# Patient Record
Sex: Male | Born: 1950 | Race: White | Hispanic: No | Marital: Married | Smoking: Never smoker
Health system: Southern US, Community
[De-identification: ages and names within clinical notes are randomized; demographics above are authoritative.]

## PROBLEM LIST (undated history)

## (undated) DIAGNOSIS — H9190 Unspecified hearing loss, unspecified ear: Secondary | ICD-10-CM

## (undated) DIAGNOSIS — K219 Gastro-esophageal reflux disease without esophagitis: Secondary | ICD-10-CM

## (undated) DIAGNOSIS — F988 Other specified behavioral and emotional disorders with onset usually occurring in childhood and adolescence: Secondary | ICD-10-CM

## (undated) DIAGNOSIS — Z8489 Family history of other specified conditions: Secondary | ICD-10-CM

## (undated) DIAGNOSIS — E7800 Pure hypercholesterolemia, unspecified: Secondary | ICD-10-CM

## (undated) DIAGNOSIS — M1990 Unspecified osteoarthritis, unspecified site: Secondary | ICD-10-CM

## (undated) HISTORY — PX: MULTIPLE TOOTH EXTRACTIONS: SHX2053

## (undated) HISTORY — PX: JOINT REPLACEMENT: SHX530

---

## 2001-04-24 ENCOUNTER — Encounter: Admission: RE | Admit: 2001-04-24 | Discharge: 2001-04-24 | Payer: Self-pay | Admitting: Internal Medicine

## 2001-04-24 ENCOUNTER — Encounter: Payer: Self-pay | Admitting: Internal Medicine

## 2004-07-06 ENCOUNTER — Ambulatory Visit (HOSPITAL_COMMUNITY): Admission: RE | Admit: 2004-07-06 | Discharge: 2004-07-06 | Payer: Self-pay | Admitting: Plastic Surgery

## 2004-07-06 ENCOUNTER — Ambulatory Visit (HOSPITAL_BASED_OUTPATIENT_CLINIC_OR_DEPARTMENT_OTHER): Admission: RE | Admit: 2004-07-06 | Discharge: 2004-07-06 | Payer: Self-pay | Admitting: Plastic Surgery

## 2006-02-20 ENCOUNTER — Encounter: Admission: RE | Admit: 2006-02-20 | Discharge: 2006-02-20 | Payer: Self-pay | Admitting: Geriatric Medicine

## 2006-05-08 ENCOUNTER — Encounter: Admission: RE | Admit: 2006-05-08 | Discharge: 2006-05-08 | Payer: Self-pay | Admitting: Orthopedic Surgery

## 2011-03-20 ENCOUNTER — Other Ambulatory Visit: Payer: Self-pay | Admitting: Otolaryngology

## 2011-03-20 DIAGNOSIS — H938X9 Other specified disorders of ear, unspecified ear: Secondary | ICD-10-CM

## 2011-04-02 ENCOUNTER — Ambulatory Visit
Admission: RE | Admit: 2011-04-02 | Discharge: 2011-04-02 | Disposition: A | Discharge status: Home / Self Care | Payer: BC Managed Care – PPO | Source: Ambulatory Visit | Attending: Otolaryngology | Admitting: Otolaryngology

## 2011-04-02 DIAGNOSIS — H938X9 Other specified disorders of ear, unspecified ear: Secondary | ICD-10-CM

## 2011-04-02 MED ORDER — GADOBENATE DIMEGLUMINE 529 MG/ML IV SOLN
17.0000 mL | Freq: Once | INTRAVENOUS | Status: AC | PRN
  Administered 2011-04-02: 17 mL via INTRAVENOUS

## 2012-03-16 ENCOUNTER — Other Ambulatory Visit: Payer: Self-pay | Admitting: Geriatric Medicine

## 2012-03-16 DIAGNOSIS — R1310 Dysphagia, unspecified: Secondary | ICD-10-CM

## 2012-03-25 ENCOUNTER — Other Ambulatory Visit: Payer: BC Managed Care – PPO

## 2012-03-27 ENCOUNTER — Ambulatory Visit
Admission: RE | Admit: 2012-03-27 | Discharge: 2012-03-27 | Disposition: A | Discharge status: Home / Self Care | Payer: BC Managed Care – PPO | Source: Ambulatory Visit | Attending: Geriatric Medicine | Admitting: Geriatric Medicine

## 2012-03-27 DIAGNOSIS — R1310 Dysphagia, unspecified: Secondary | ICD-10-CM

## 2015-03-30 ENCOUNTER — Ambulatory Visit
Admission: RE | Admit: 2015-03-30 | Discharge: 2015-03-30 | Disposition: A | Discharge status: Home / Self Care | Payer: BLUE CROSS/BLUE SHIELD | Source: Ambulatory Visit | Attending: Geriatric Medicine | Admitting: Geriatric Medicine

## 2015-03-30 ENCOUNTER — Other Ambulatory Visit: Payer: Self-pay | Admitting: Geriatric Medicine

## 2015-03-30 DIAGNOSIS — R05 Cough: Secondary | ICD-10-CM

## 2015-03-30 DIAGNOSIS — R059 Cough, unspecified: Secondary | ICD-10-CM

## 2015-06-28 ENCOUNTER — Other Ambulatory Visit: Payer: Self-pay | Admitting: Gastroenterology

## 2015-08-01 ENCOUNTER — Encounter (HOSPITAL_COMMUNITY): Payer: Self-pay | Admitting: *Deleted

## 2015-08-08 ENCOUNTER — Ambulatory Visit (HOSPITAL_COMMUNITY)
Admission: RE | Admit: 2015-08-08 | Discharge: 2015-08-08 | Disposition: A | Discharge status: Home / Self Care | Payer: BLUE CROSS/BLUE SHIELD | Source: Ambulatory Visit | Attending: Gastroenterology | Admitting: Gastroenterology

## 2015-08-08 ENCOUNTER — Encounter (HOSPITAL_COMMUNITY)
Admission: RE | Disposition: A | Discharge status: Home / Self Care | Payer: Self-pay | Source: Ambulatory Visit | Attending: Gastroenterology

## 2015-08-08 ENCOUNTER — Ambulatory Visit (HOSPITAL_COMMUNITY): Payer: BLUE CROSS/BLUE SHIELD | Admitting: Anesthesiology

## 2015-08-08 ENCOUNTER — Encounter (HOSPITAL_COMMUNITY): Payer: Self-pay

## 2015-08-08 DIAGNOSIS — K219 Gastro-esophageal reflux disease without esophagitis: Secondary | ICD-10-CM | POA: Insufficient documentation

## 2015-08-08 DIAGNOSIS — Z96643 Presence of artificial hip joint, bilateral: Secondary | ICD-10-CM | POA: Insufficient documentation

## 2015-08-08 DIAGNOSIS — I4510 Unspecified right bundle-branch block: Secondary | ICD-10-CM | POA: Insufficient documentation

## 2015-08-08 DIAGNOSIS — D125 Benign neoplasm of sigmoid colon: Secondary | ICD-10-CM | POA: Insufficient documentation

## 2015-08-08 DIAGNOSIS — Z1211 Encounter for screening for malignant neoplasm of colon: Secondary | ICD-10-CM | POA: Diagnosis not present

## 2015-08-08 DIAGNOSIS — E7800 Pure hypercholesterolemia, unspecified: Secondary | ICD-10-CM | POA: Diagnosis not present

## 2015-08-08 HISTORY — DX: Pure hypercholesterolemia, unspecified: E78.00

## 2015-08-08 HISTORY — DX: Other specified behavioral and emotional disorders with onset usually occurring in childhood and adolescence: F98.8

## 2015-08-08 HISTORY — DX: Unspecified osteoarthritis, unspecified site: M19.90

## 2015-08-08 HISTORY — PX: COLONOSCOPY WITH PROPOFOL: SHX5780

## 2015-08-08 HISTORY — DX: Unspecified hearing loss, unspecified ear: H91.90

## 2015-08-08 HISTORY — DX: Gastro-esophageal reflux disease without esophagitis: K21.9

## 2015-08-08 HISTORY — DX: Family history of other specified conditions: Z84.89

## 2015-08-08 SURGERY — COLONOSCOPY WITH PROPOFOL
Anesthesia: Monitor Anesthesia Care

## 2015-08-08 MED ORDER — LACTATED RINGERS IV SOLN
INTRAVENOUS | Status: DC
  Administered 2015-08-08: 1000 mL via INTRAVENOUS

## 2015-08-08 MED ORDER — SODIUM CHLORIDE 0.9 % IV SOLN: INTRAVENOUS | Status: DC

## 2015-08-08 MED ORDER — LIDOCAINE HCL (CARDIAC) 20 MG/ML IV SOLN
INTRAVENOUS | Status: DC | PRN
  Administered 2015-08-08: 25 mg via INTRATRACHEAL

## 2015-08-08 MED ORDER — PROPOFOL 10 MG/ML IV BOLUS
INTRAVENOUS | Status: AC
  Filled 2015-08-08: qty 60

## 2015-08-08 MED ORDER — PROPOFOL 10 MG/ML IV BOLUS
INTRAVENOUS | Status: DC | PRN
  Administered 2015-08-08: 30 mg via INTRAVENOUS

## 2015-08-08 MED ORDER — PROPOFOL 500 MG/50ML IV EMUL
INTRAVENOUS | Status: DC | PRN
  Administered 2015-08-08: 120 ug/kg/min via INTRAVENOUS

## 2015-08-08 MED ORDER — LIDOCAINE HCL (CARDIAC) 20 MG/ML IV SOLN
INTRAVENOUS | Status: AC
  Filled 2015-08-08: qty 5

## 2015-08-08 MED ORDER — LACTATED RINGERS IV SOLN: INTRAVENOUS | Status: DC

## 2015-08-08 SURGICAL SUPPLY — 21 items

## 2015-08-08 NOTE — Discharge Instructions (Signed)

## 2015-08-08 NOTE — Transfer of Care (Signed)
Immediate Anesthesia Transfer of Care Note  Patient: Earl Gregory  Procedure(s) Performed: Procedure(s): COLONOSCOPY WITH PROPOFOL (N/A)  Patient Location: PACU and Endoscopy Unit  Anesthesia Type:MAC  Level of Consciousness: awake and patient cooperative  Airway & Oxygen Therapy: Patient Spontanous Breathing and Patient connected to face mask oxygen  Post-op Assessment: Report given to RN and Post -op Vital signs reviewed and stable  Post vital signs: Reviewed and stable  Last Vitals:  Filed Vitals:   08/08/15 0740  BP: 141/94  Pulse: 76  Temp: 36.4 C  Resp: 21    Complications: No apparent anesthesia complications

## 2015-08-08 NOTE — Op Note (Signed)
[facility] Patient Name: Earl Gregory Procedure Date: 08/08/2015 MRN: [id number] Attending MD: Garlan Fair , MD Date of Birth: [birth year] CSN:  Age: 65 Admit Type: Outpatient Procedure:                Colonoscopy Indications:              Screening for colorectal malignant neoplasm Providers:                Garlan Fair, MD, Sarah Monday, RN, [place] Dalton, Technician Referring MD:              Medicines:                Propofol per Anesthesia Complications:            No immediate complications. Estimated Blood Loss:     Estimated blood loss: none. Procedure:                Pre-Anesthesia Assessment:                           - Prior to the procedure, a History and Physical                            was performed, and patient medications and                            allergies were reviewed. The patient's tolerance of                            previous anesthesia was also reviewed. The risks                            and benefits of the procedure and the sedation                            options and risks were discussed with the patient.                            All questions were answered, and informed consent                            was obtained. Prior Anticoagulants: The patient has                            taken no previous anticoagulant or antiplatelet                            agents. ASA Grade Assessment: II - A patient with                            mild systemic disease. After reviewing the risks  and benefits, the patient was deemed in                            satisfactory condition to undergo the procedure.                           After obtaining informed consent, the colonoscope                            was passed under direct vision. Throughout the                            procedure, the patient's blood pressure, pulse, and                            oxygen  saturations were monitored continuously. The                            was introduced through the anus and advanced to the                            the cecum, identified by appendiceal orifice and                            ileocecal valve. The colonoscopy was performed                            without difficulty. The patient tolerated the                            procedure well. The quality of the bowel                            preparation was good. The appendiceal orifice and                            the rectum were photographed. Scope In: 8:37:15 AM Scope Out: 8:55:52 AM Scope Withdrawal Time: 0 hours 11 minutes 10 seconds  Total Procedure Duration: 0 hours 18 minutes 37 seconds  Findings:      The perianal and digital rectal examinations were normal.      A 6 mm polyp was found in the mid sigmoid colon. The polyp was       semi-pedunculated. The polyp was removed with a cold snare. Resection       and retrieval were complete.      The exam was otherwise without abnormality. Impression:               - One 6 mm polyp in the mid sigmoid colon, removed                            with a cold snare. Resected and retrieved.                           - The examination was otherwise normal. Moderate  Sedation:      N/A- Per Anesthesia Care Recommendation:           - Patient has a contact number available for                            emergencies. The signs and symptoms of potential                            delayed complications were discussed with the                            patient. Return to normal activities tomorrow.                            Written discharge instructions were provided to the                            patient.                           - Repeat colonoscopy date to be determined after                            pending pathology results are reviewed for                            screening purposes.                           - Resume previous  diet.                           - Continue present medications. Procedure Code(s):        --- Professional ---                           [phone], Colonoscopy, flexible; with removal of                            tumor(s), polyp(s), or other lesion(s) by snare                            technique Diagnosis Code(s):        --- Professional ---                           Z12.11, Encounter for screening for malignant                            neoplasm of colon                           D12.5, Benign neoplasm of sigmoid colon CPT copyright 2016 [employer]. All rights reserved. The codes documented in this report are preliminary and upon coder review may  be revised to meet current compliance requirements. [name], MD Garlan Fair, MD 08/08/2015 9:01:55 AM  This report has been signed electronically. Number of Addenda: 0

## 2015-08-08 NOTE — Anesthesia Postprocedure Evaluation (Signed)
Anesthesia Post Note  Patient: Earl Gregory  Procedure(s) Performed: Procedure(s) (LRB): COLONOSCOPY WITH PROPOFOL (N/A)  Patient location during evaluation: PACU Anesthesia Type: MAC Level of consciousness: awake and alert Pain management: pain level controlled Vital Signs Assessment: post-procedure vital signs reviewed and stable Respiratory status: spontaneous breathing, nonlabored ventilation, respiratory function stable and patient connected to nasal cannula oxygen Cardiovascular status: stable and blood pressure returned to baseline Anesthetic complications: no    Last Vitals:  Filed Vitals:   08/08/15 0922 08/08/15 0932  BP: 126/79 137/90  Pulse: 60 64  Temp:    Resp: 17 16    Last Pain: There were no vitals filed for this visit.               [name] J

## 2015-08-08 NOTE — Anesthesia Preprocedure Evaluation (Addendum)
Anesthesia Evaluation  Patient identified by MRN, date of birth, ID band Patient awake    Reviewed: Allergy & Precautions, NPO status , Patient's Chart, lab work & pertinent test results  History of Anesthesia Complications (+) Family history of anesthesia reaction  Airway Mallampati: II  TM Distance: >3 FB Neck ROM: Full    Dental no notable dental hx.    Pulmonary neg pulmonary ROS,    Pulmonary exam normal breath sounds clear to auscultation       Cardiovascular negative cardio ROS Normal cardiovascular exam Rhythm:Regular Rate:Normal     Neuro/Psych PSYCHIATRIC DISORDERS negative neurological ROS     GI/Hepatic Neg liver ROS, GERD  Medicated,  Endo/Other  negative endocrine ROS  Renal/GU negative Renal ROS  negative genitourinary   Musculoskeletal  (+) Arthritis ,   Abdominal   Peds negative pediatric ROS (+)  Hematology negative hematology ROS (+)   Anesthesia Other Findings   Reproductive/Obstetrics negative OB ROS                             Anesthesia Physical Anesthesia Plan  ASA: II  Anesthesia Plan: MAC   Post-op Pain Management:    Induction: Intravenous  Airway Management Planned: Natural Airway  Additional Equipment:   Intra-op Plan:   Post-operative Plan:   Informed Consent: I have reviewed the patients History and Physical, chart, labs and discussed the procedure including the risks, benefits and alternatives for the proposed anesthesia with the patient or authorized representative who has indicated his/her understanding and acceptance.   Dental advisory given  Plan Discussed with: CRNA  Anesthesia Plan Comments:         Anesthesia Quick Evaluation

## 2015-08-08 NOTE — H&P (Signed)
  Procedure: Screening colonoscopy. Normal screening colonoscopy was performed on 07/05/2005  History: The patient is a 65 year old male born [birth year]. He is scheduled to undergo a repeat screening colonoscopy today.  Past medical history: Bilateral hip replacement surgeries. Deviated nasal septum surgery. Allergic rhinitis. Hypercholesterolemia. Incomplete right bundle branch lock pattern by electrocardiogram.  Medication allergies: Lipitor causes muscle aches  Exam: The patient is alert and lying comfortably on the endoscopy stretcher. Abdomen is soft and nontender to palpation. Lungs are clear to auscultation. Cardiac exam reveals a regular rhythm.  Plan: Proceed with screening colonoscopy

## 2015-08-09 ENCOUNTER — Encounter (HOSPITAL_COMMUNITY): Payer: Self-pay | Admitting: Gastroenterology

## 2018-04-24 ENCOUNTER — Ambulatory Visit
Admission: RE | Admit: 2018-04-24 | Discharge: 2018-04-24 | Disposition: A | Discharge status: Home / Self Care | Payer: BLUE CROSS/BLUE SHIELD | Source: Ambulatory Visit | Attending: Geriatric Medicine | Admitting: Geriatric Medicine

## 2018-04-24 ENCOUNTER — Other Ambulatory Visit: Payer: Self-pay | Admitting: Geriatric Medicine

## 2018-04-24 DIAGNOSIS — K219 Gastro-esophageal reflux disease without esophagitis: Secondary | ICD-10-CM | POA: Diagnosis not present

## 2018-04-24 DIAGNOSIS — E559 Vitamin D deficiency, unspecified: Secondary | ICD-10-CM | POA: Diagnosis not present

## 2018-04-24 DIAGNOSIS — E7800 Pure hypercholesterolemia, unspecified: Secondary | ICD-10-CM | POA: Diagnosis not present

## 2018-04-24 DIAGNOSIS — M542 Cervicalgia: Secondary | ICD-10-CM | POA: Diagnosis not present

## 2018-04-24 DIAGNOSIS — Z23 Encounter for immunization: Secondary | ICD-10-CM | POA: Diagnosis not present

## 2018-04-24 DIAGNOSIS — Z79899 Other long term (current) drug therapy: Secondary | ICD-10-CM | POA: Diagnosis not present

## 2018-04-24 DIAGNOSIS — Z0000 Encounter for general adult medical examination without abnormal findings: Secondary | ICD-10-CM | POA: Diagnosis not present

## 2018-04-24 DIAGNOSIS — M8580 Other specified disorders of bone density and structure, unspecified site: Secondary | ICD-10-CM | POA: Diagnosis not present

## 2018-04-29 DIAGNOSIS — E7800 Pure hypercholesterolemia, unspecified: Secondary | ICD-10-CM | POA: Diagnosis not present

## 2018-04-29 DIAGNOSIS — Z125 Encounter for screening for malignant neoplasm of prostate: Secondary | ICD-10-CM | POA: Diagnosis not present

## 2018-04-29 DIAGNOSIS — E559 Vitamin D deficiency, unspecified: Secondary | ICD-10-CM | POA: Diagnosis not present

## 2018-04-29 DIAGNOSIS — Z0000 Encounter for general adult medical examination without abnormal findings: Secondary | ICD-10-CM | POA: Diagnosis not present

## 2018-04-29 DIAGNOSIS — Z79899 Other long term (current) drug therapy: Secondary | ICD-10-CM | POA: Diagnosis not present

## 2018-05-08 DIAGNOSIS — M542 Cervicalgia: Secondary | ICD-10-CM | POA: Diagnosis not present

## 2018-05-11 DIAGNOSIS — M542 Cervicalgia: Secondary | ICD-10-CM | POA: Diagnosis not present

## 2018-05-18 DIAGNOSIS — M542 Cervicalgia: Secondary | ICD-10-CM | POA: Diagnosis not present

## 2018-05-20 DIAGNOSIS — H524 Presbyopia: Secondary | ICD-10-CM | POA: Diagnosis not present

## 2018-05-20 DIAGNOSIS — H52223 Regular astigmatism, bilateral: Secondary | ICD-10-CM | POA: Diagnosis not present

## 2018-05-20 DIAGNOSIS — Z0100 Encounter for examination of eyes and vision without abnormal findings: Secondary | ICD-10-CM | POA: Diagnosis not present

## 2018-05-20 DIAGNOSIS — H2513 Age-related nuclear cataract, bilateral: Secondary | ICD-10-CM | POA: Diagnosis not present

## 2018-05-20 DIAGNOSIS — D3131 Benign neoplasm of right choroid: Secondary | ICD-10-CM | POA: Diagnosis not present

## 2018-05-22 DIAGNOSIS — D225 Melanocytic nevi of trunk: Secondary | ICD-10-CM | POA: Diagnosis not present

## 2018-05-22 DIAGNOSIS — D485 Neoplasm of uncertain behavior of skin: Secondary | ICD-10-CM | POA: Diagnosis not present

## 2018-05-22 DIAGNOSIS — D1801 Hemangioma of skin and subcutaneous tissue: Secondary | ICD-10-CM | POA: Diagnosis not present

## 2018-05-22 DIAGNOSIS — L281 Prurigo nodularis: Secondary | ICD-10-CM | POA: Diagnosis not present

## 2018-05-22 DIAGNOSIS — L821 Other seborrheic keratosis: Secondary | ICD-10-CM | POA: Diagnosis not present

## 2018-05-27 DIAGNOSIS — M542 Cervicalgia: Secondary | ICD-10-CM | POA: Diagnosis not present

## 2018-06-02 DIAGNOSIS — M542 Cervicalgia: Secondary | ICD-10-CM | POA: Diagnosis not present

## 2018-06-11 DIAGNOSIS — M542 Cervicalgia: Secondary | ICD-10-CM | POA: Diagnosis not present

## 2018-08-10 DIAGNOSIS — M779 Enthesopathy, unspecified: Secondary | ICD-10-CM

## 2018-10-06 DIAGNOSIS — Z03818 Encounter for observation for suspected exposure to other biological agents ruled out: Secondary | ICD-10-CM | POA: Diagnosis not present

## 2018-10-13 ENCOUNTER — Telehealth: Payer: Self-pay

## 2018-10-13 DIAGNOSIS — Z20822 Contact with and (suspected) exposure to covid-19: Secondary | ICD-10-CM

## 2018-10-13 NOTE — Addendum Note (Signed)
Addended by: Curlene Labrum on: 10/13/2018 02:28 PM   Modules accepted: Orders

## 2018-10-13 NOTE — Telephone Encounter (Signed)
Pt returned call for covid-19 testing. Scheduled for tomorrow at [facility] at 10:15. Advised to wear a mask, stay in car with windows rolled up until time for tesing. He voiced understanding.

## 2018-10-13 NOTE — Telephone Encounter (Signed)
Call received from [facility]. Livia Snellen CMA states that patient needs COVID-19 testing.  Office 336 [phone] Fax [phone]   Call placed to patient. Left VM to return call for testing @ [phone].

## 2018-10-14 ENCOUNTER — Other Ambulatory Visit: Payer: PPO

## 2018-10-14 DIAGNOSIS — R6889 Other general symptoms and signs: Secondary | ICD-10-CM | POA: Diagnosis not present

## 2018-10-14 DIAGNOSIS — Z20822 Contact with and (suspected) exposure to covid-19: Secondary | ICD-10-CM

## 2018-10-20 LAB — NOVEL CORONAVIRUS, NAA: SARS-CoV-2, NAA: NOT DETECTED

## 2018-11-11 DIAGNOSIS — H903 Sensorineural hearing loss, bilateral: Secondary | ICD-10-CM | POA: Diagnosis not present

## 2019-04-30 DIAGNOSIS — Z1389 Encounter for screening for other disorder: Secondary | ICD-10-CM | POA: Diagnosis not present

## 2019-04-30 DIAGNOSIS — E7800 Pure hypercholesterolemia, unspecified: Secondary | ICD-10-CM | POA: Diagnosis not present

## 2019-04-30 DIAGNOSIS — Z1331 Encounter for screening for depression: Secondary | ICD-10-CM | POA: Diagnosis not present

## 2019-04-30 DIAGNOSIS — Z125 Encounter for screening for malignant neoplasm of prostate: Secondary | ICD-10-CM | POA: Diagnosis not present

## 2019-04-30 DIAGNOSIS — Z0000 Encounter for general adult medical examination without abnormal findings: Secondary | ICD-10-CM | POA: Diagnosis not present

## 2019-04-30 DIAGNOSIS — K219 Gastro-esophageal reflux disease without esophagitis: Secondary | ICD-10-CM | POA: Diagnosis not present

## 2019-04-30 DIAGNOSIS — Z79899 Other long term (current) drug therapy: Secondary | ICD-10-CM | POA: Diagnosis not present

## 2019-04-30 DIAGNOSIS — M8580 Other specified disorders of bone density and structure, unspecified site: Secondary | ICD-10-CM | POA: Diagnosis not present

## 2019-04-30 DIAGNOSIS — E559 Vitamin D deficiency, unspecified: Secondary | ICD-10-CM | POA: Diagnosis not present

## 2019-05-14 ENCOUNTER — Ambulatory Visit: Payer: PPO

## 2019-05-17 ENCOUNTER — Ambulatory Visit: Payer: PPO

## 2019-05-20 ENCOUNTER — Ambulatory Visit: Payer: PPO | Attending: Internal Medicine

## 2019-05-20 DIAGNOSIS — Z23 Encounter for immunization: Secondary | ICD-10-CM | POA: Insufficient documentation

## 2019-05-20 NOTE — Progress Notes (Signed)
   Covid-19 Vaccination Clinic  Name:  Earl Gregory    MRN: [id number] DOB: May 25, 1950  05/20/2019  [name] was observed post Covid-19 immunization for 15 minutes without incidence. He was provided with Vaccine Information Sheet and instruction to access the V-Safe system.   [name] was instructed to call 911 with any severe reactions post vaccine: Marland Kitchen Difficulty breathing  . Swelling of your face and throat  . A fast heartbeat  . A bad rash all over your body  . Dizziness and weakness    Immunizations Administered    Name Date Dose VIS Date Route   Pfizer COVID-19 Vaccine 05/20/2019  9:31 AM 0.3 mL 03/26/2019 Intramuscular   Manufacturer: [employer], [employer]   Lot: [id number]   [place]: [id number]     Patient reports feeling light headed but is ok to leave the facility.

## 2019-05-25 ENCOUNTER — Ambulatory Visit: Payer: PPO

## 2019-06-14 ENCOUNTER — Ambulatory Visit: Payer: PPO | Attending: Internal Medicine

## 2019-06-14 DIAGNOSIS — Z23 Encounter for immunization: Secondary | ICD-10-CM

## 2019-06-14 NOTE — Progress Notes (Signed)
   Covid-19 Vaccination Clinic  Name:  Earl Gregory    MRN: [id number] DOB: [birth year]  06/14/2019  Earl Gregory was observed post Covid-19 immunization for 15 minutes without incidence. He was provided with Vaccine Information Sheet and instruction to access the V-Safe system.   Earl Gregory was instructed to call 911 with any severe reactions post vaccine: Marland Kitchen Difficulty breathing  . Swelling of your face and throat  . A fast heartbeat  . A bad rash all over your body  . Dizziness and weakness    Immunizations Administered    Name Date Dose VIS Date Route   Pfizer COVID-19 Vaccine 06/14/2019  2:15 PM 0.3 mL 03/26/2019 Intramuscular   Manufacturer: [employer]   Lot: [id number]   [place]: [id number]

## 2019-08-09 DIAGNOSIS — M8580 Other specified disorders of bone density and structure, unspecified site: Secondary | ICD-10-CM | POA: Diagnosis not present

## 2019-08-09 DIAGNOSIS — E7800 Pure hypercholesterolemia, unspecified: Secondary | ICD-10-CM | POA: Diagnosis not present

## 2020-02-10 DIAGNOSIS — E7800 Pure hypercholesterolemia, unspecified: Secondary | ICD-10-CM | POA: Diagnosis not present

## 2020-02-10 DIAGNOSIS — M8580 Other specified disorders of bone density and structure, unspecified site: Secondary | ICD-10-CM | POA: Diagnosis not present

## 2020-03-04 IMAGING — CR DG CERVICAL SPINE COMPLETE 4+V
5 series · 5 of 5 positions shown · non-contrast
Comparison: None.

CLINICAL DATA: Chronic left neck pain radiating to the left
shoulder.

EXAM:
CERVICAL SPINE - COMPLETE 4+ VIEW

[w c-spine lat]
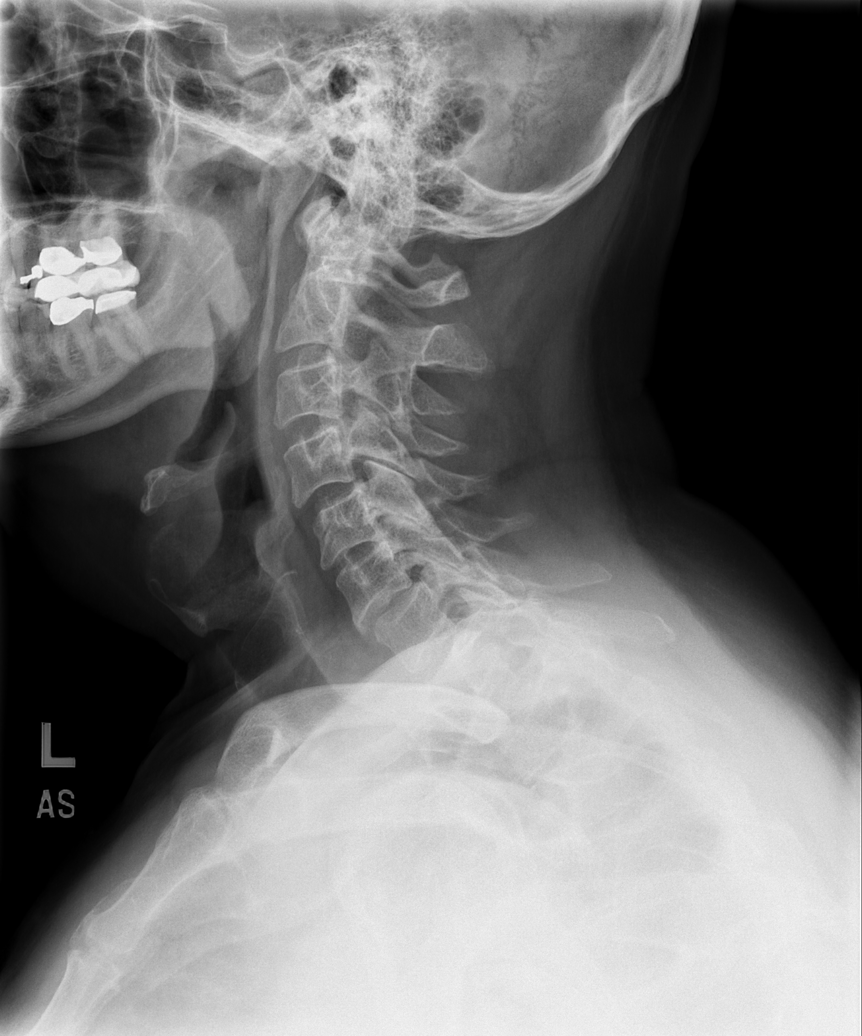

[w c-spine oblique (1 of 2)]
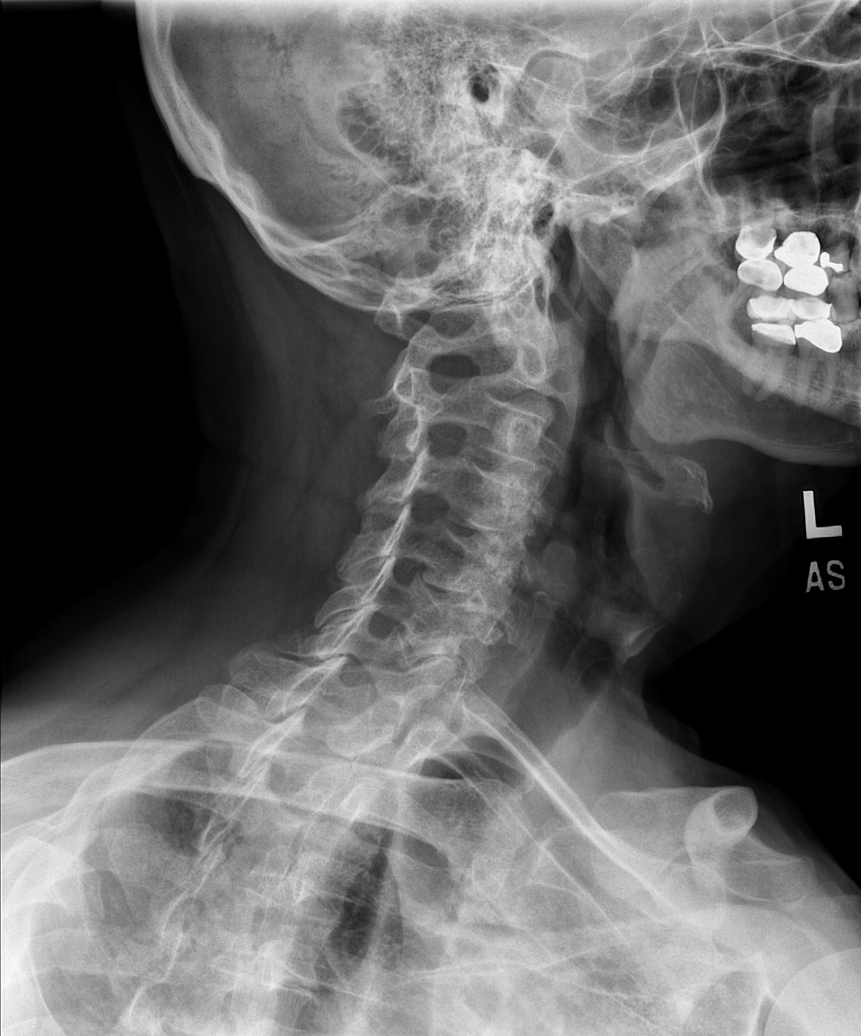

[w c-spine oblique (2 of 2)]
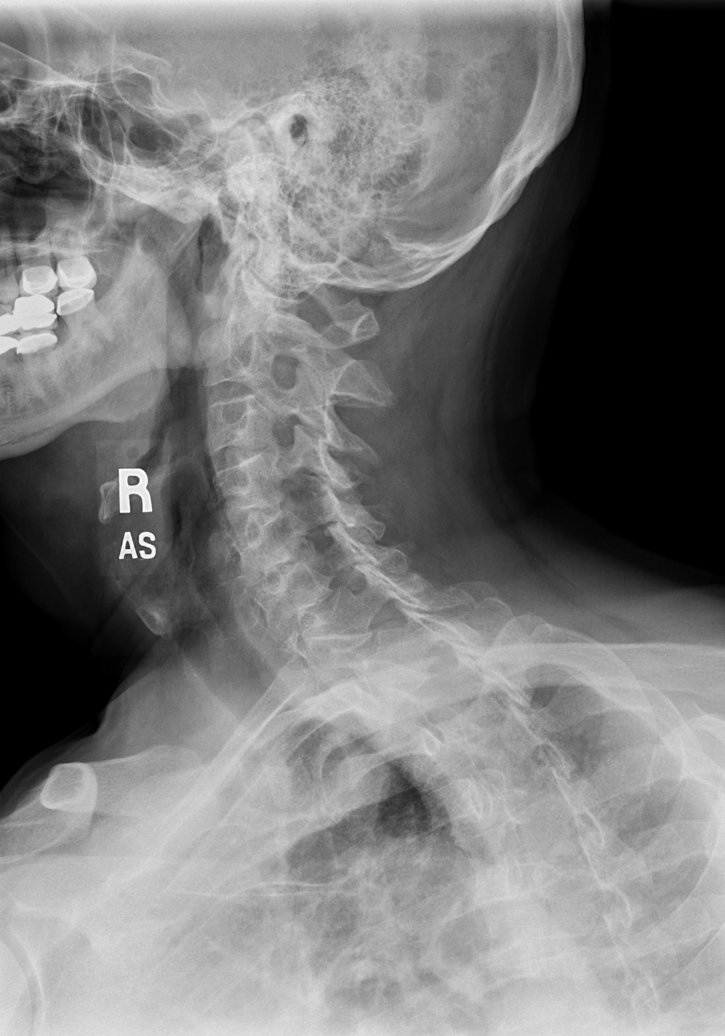

[w c-spine a.p. *]
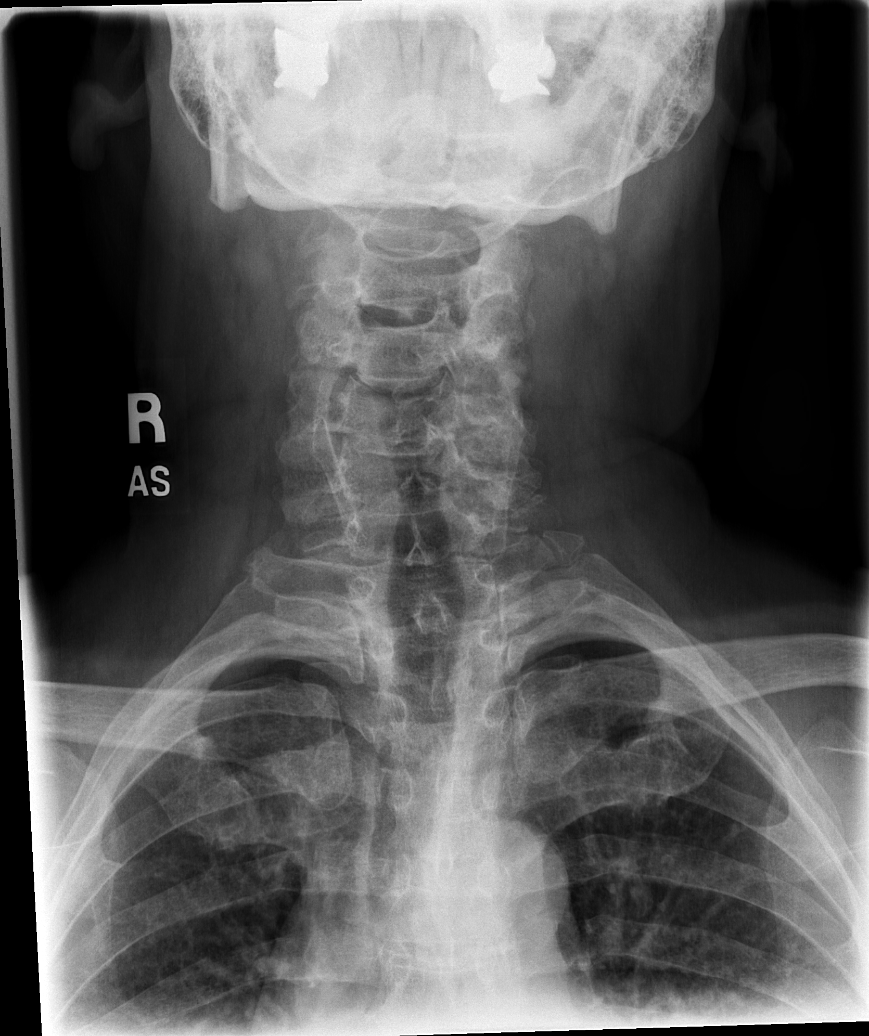

[w c-spine odontoid *]
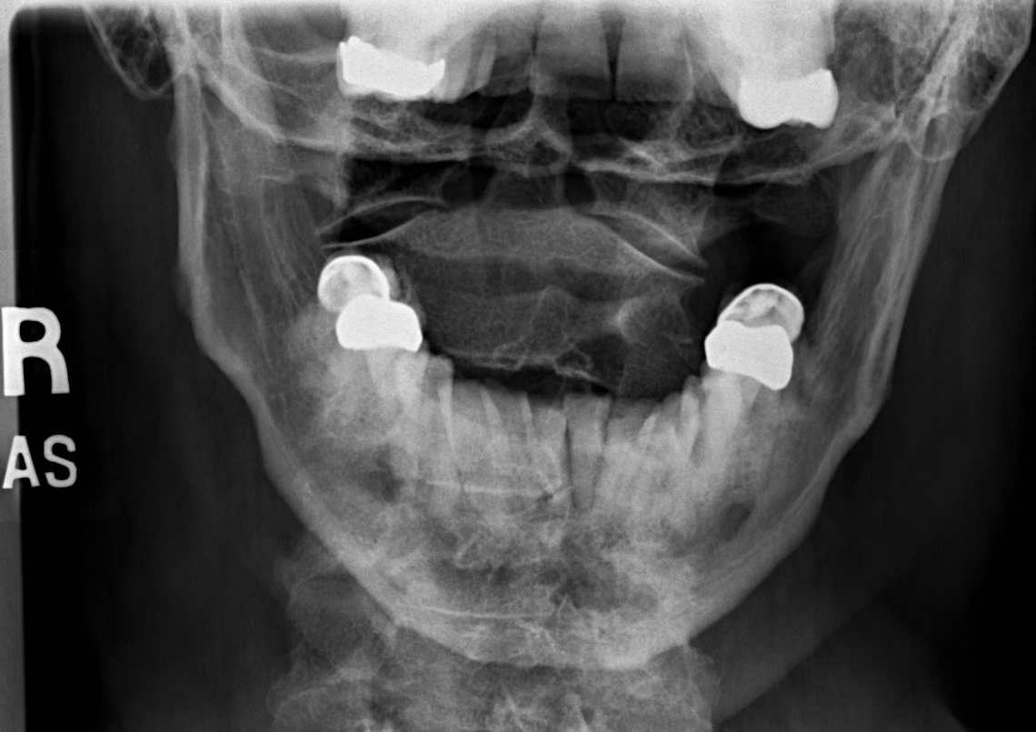

[5 of 5 positions shown; findings below may reference images not displayed]

FINDINGS: Visualization through C7 on lateral view. Grade 1 anterolisthesis of
C6 on C7. C5-6 and C6-7 degenerative disc disease. C5-6 C6-7 and
C7-T1 did degenerative facet changes. Prevertebral soft tissues
unremarkable. Lung apices are clear. Lateral masses articulate
appropriately with the dens.
IMPRESSION: Degenerative disc and facet disease.  No acute process.

Grade 1 anterolisthesis C6 on C7 likely secondary to degenerative
changes.

## 2020-04-05 DIAGNOSIS — K219 Gastro-esophageal reflux disease without esophagitis: Secondary | ICD-10-CM | POA: Diagnosis not present

## 2020-04-05 DIAGNOSIS — M8580 Other specified disorders of bone density and structure, unspecified site: Secondary | ICD-10-CM | POA: Diagnosis not present

## 2020-04-05 DIAGNOSIS — E7800 Pure hypercholesterolemia, unspecified: Secondary | ICD-10-CM | POA: Diagnosis not present

## 2020-05-15 DIAGNOSIS — M8580 Other specified disorders of bone density and structure, unspecified site: Secondary | ICD-10-CM | POA: Diagnosis not present

## 2020-05-15 DIAGNOSIS — K219 Gastro-esophageal reflux disease without esophagitis: Secondary | ICD-10-CM | POA: Diagnosis not present

## 2020-05-15 DIAGNOSIS — E7800 Pure hypercholesterolemia, unspecified: Secondary | ICD-10-CM | POA: Diagnosis not present

## 2020-05-26 DIAGNOSIS — Z0000 Encounter for general adult medical examination without abnormal findings: Secondary | ICD-10-CM | POA: Diagnosis not present

## 2020-05-26 DIAGNOSIS — E7800 Pure hypercholesterolemia, unspecified: Secondary | ICD-10-CM | POA: Diagnosis not present

## 2020-05-26 DIAGNOSIS — Z1389 Encounter for screening for other disorder: Secondary | ICD-10-CM | POA: Diagnosis not present

## 2020-05-26 DIAGNOSIS — K219 Gastro-esophageal reflux disease without esophagitis: Secondary | ICD-10-CM | POA: Diagnosis not present

## 2020-05-26 DIAGNOSIS — Z79899 Other long term (current) drug therapy: Secondary | ICD-10-CM | POA: Diagnosis not present

## 2020-05-26 DIAGNOSIS — M8580 Other specified disorders of bone density and structure, unspecified site: Secondary | ICD-10-CM | POA: Diagnosis not present

## 2020-05-26 DIAGNOSIS — D126 Benign neoplasm of colon, unspecified: Secondary | ICD-10-CM | POA: Diagnosis not present

## 2020-05-26 DIAGNOSIS — E559 Vitamin D deficiency, unspecified: Secondary | ICD-10-CM | POA: Diagnosis not present

## 2020-05-26 DIAGNOSIS — Z125 Encounter for screening for malignant neoplasm of prostate: Secondary | ICD-10-CM | POA: Diagnosis not present

## 2020-08-09 DIAGNOSIS — K219 Gastro-esophageal reflux disease without esophagitis: Secondary | ICD-10-CM | POA: Diagnosis not present

## 2020-08-09 DIAGNOSIS — E7800 Pure hypercholesterolemia, unspecified: Secondary | ICD-10-CM | POA: Diagnosis not present

## 2020-08-09 DIAGNOSIS — M8580 Other specified disorders of bone density and structure, unspecified site: Secondary | ICD-10-CM | POA: Diagnosis not present

## 2020-09-22 DIAGNOSIS — M8580 Other specified disorders of bone density and structure, unspecified site: Secondary | ICD-10-CM | POA: Diagnosis not present

## 2020-09-22 DIAGNOSIS — K219 Gastro-esophageal reflux disease without esophagitis: Secondary | ICD-10-CM | POA: Diagnosis not present

## 2020-09-22 DIAGNOSIS — E7800 Pure hypercholesterolemia, unspecified: Secondary | ICD-10-CM | POA: Diagnosis not present

## 2021-01-22 DIAGNOSIS — K5730 Diverticulosis of large intestine without perforation or abscess without bleeding: Secondary | ICD-10-CM | POA: Diagnosis not present

## 2021-01-22 DIAGNOSIS — D122 Benign neoplasm of ascending colon: Secondary | ICD-10-CM | POA: Diagnosis not present

## 2021-01-22 DIAGNOSIS — Z86010 Personal history of colonic polyps: Secondary | ICD-10-CM | POA: Diagnosis not present

## 2021-01-24 DIAGNOSIS — D122 Benign neoplasm of ascending colon: Secondary | ICD-10-CM | POA: Diagnosis not present

## 2021-02-16 DIAGNOSIS — U071 COVID-19: Secondary | ICD-10-CM | POA: Diagnosis not present

## 2021-02-16 DIAGNOSIS — R051 Acute cough: Secondary | ICD-10-CM | POA: Diagnosis not present

## 2021-02-16 DIAGNOSIS — Z20822 Contact with and (suspected) exposure to covid-19: Secondary | ICD-10-CM | POA: Diagnosis not present

## 2021-05-14 DIAGNOSIS — R051 Acute cough: Secondary | ICD-10-CM | POA: Diagnosis not present

## 2021-05-14 DIAGNOSIS — Z03818 Encounter for observation for suspected exposure to other biological agents ruled out: Secondary | ICD-10-CM | POA: Diagnosis not present

## 2021-05-14 DIAGNOSIS — R0981 Nasal congestion: Secondary | ICD-10-CM | POA: Diagnosis not present

## 2021-05-29 DIAGNOSIS — Z79899 Other long term (current) drug therapy: Secondary | ICD-10-CM | POA: Diagnosis not present

## 2021-05-29 DIAGNOSIS — K219 Gastro-esophageal reflux disease without esophagitis: Secondary | ICD-10-CM | POA: Diagnosis not present

## 2021-05-29 DIAGNOSIS — Z125 Encounter for screening for malignant neoplasm of prostate: Secondary | ICD-10-CM | POA: Diagnosis not present

## 2021-05-29 DIAGNOSIS — Z0000 Encounter for general adult medical examination without abnormal findings: Secondary | ICD-10-CM | POA: Diagnosis not present

## 2021-05-29 DIAGNOSIS — R030 Elevated blood-pressure reading, without diagnosis of hypertension: Secondary | ICD-10-CM | POA: Diagnosis not present

## 2021-05-29 DIAGNOSIS — E559 Vitamin D deficiency, unspecified: Secondary | ICD-10-CM | POA: Diagnosis not present

## 2021-05-29 DIAGNOSIS — E7800 Pure hypercholesterolemia, unspecified: Secondary | ICD-10-CM | POA: Diagnosis not present

## 2021-11-29 ENCOUNTER — Other Ambulatory Visit: Payer: Self-pay | Admitting: Geriatric Medicine

## 2021-11-29 ENCOUNTER — Ambulatory Visit
Admission: RE | Admit: 2021-11-29 | Discharge: 2021-11-29 | Disposition: A | Discharge status: Home / Self Care | Payer: PPO | Source: Ambulatory Visit | Attending: Geriatric Medicine | Admitting: Geriatric Medicine

## 2021-11-29 DIAGNOSIS — M25551 Pain in right hip: Secondary | ICD-10-CM | POA: Diagnosis not present

## 2021-11-29 DIAGNOSIS — M160 Bilateral primary osteoarthritis of hip: Secondary | ICD-10-CM

## 2021-11-29 DIAGNOSIS — R972 Elevated prostate specific antigen [PSA]: Secondary | ICD-10-CM | POA: Diagnosis not present

## 2021-11-29 DIAGNOSIS — M25552 Pain in left hip: Secondary | ICD-10-CM | POA: Diagnosis not present

## 2022-04-02 DIAGNOSIS — R030 Elevated blood-pressure reading, without diagnosis of hypertension: Secondary | ICD-10-CM | POA: Diagnosis not present

## 2022-05-15 DIAGNOSIS — K219 Gastro-esophageal reflux disease without esophagitis: Secondary | ICD-10-CM | POA: Diagnosis not present

## 2022-05-15 DIAGNOSIS — E559 Vitamin D deficiency, unspecified: Secondary | ICD-10-CM | POA: Diagnosis not present

## 2022-05-15 DIAGNOSIS — Z79899 Other long term (current) drug therapy: Secondary | ICD-10-CM | POA: Diagnosis not present

## 2022-05-15 DIAGNOSIS — I10 Essential (primary) hypertension: Secondary | ICD-10-CM | POA: Diagnosis not present

## 2022-05-15 DIAGNOSIS — E7800 Pure hypercholesterolemia, unspecified: Secondary | ICD-10-CM | POA: Diagnosis not present

## 2023-01-06 ENCOUNTER — Ambulatory Visit
Admission: RE | Admit: 2023-01-06 | Discharge: 2023-01-06 | Disposition: A | Discharge status: Home / Self Care | Payer: PPO | Source: Ambulatory Visit | Attending: Internal Medicine | Admitting: Internal Medicine

## 2023-01-06 ENCOUNTER — Other Ambulatory Visit: Payer: Self-pay | Admitting: Internal Medicine

## 2023-01-06 DIAGNOSIS — M545 Low back pain, unspecified: Secondary | ICD-10-CM

## 2023-01-06 DIAGNOSIS — M25552 Pain in left hip: Secondary | ICD-10-CM
